# Patient Record
Sex: Male | Born: 1985 | Race: White | Hispanic: No | Marital: Married | Smoking: Never smoker
Health system: Southern US, Community
[De-identification: ages and names within clinical notes are randomized; demographics above are authoritative.]

## PROBLEM LIST (undated history)

## (undated) DIAGNOSIS — G4730 Sleep apnea, unspecified: Secondary | ICD-10-CM

## (undated) DIAGNOSIS — E669 Obesity, unspecified: Secondary | ICD-10-CM

## (undated) HISTORY — PX: WISDOM TOOTH EXTRACTION: SHX21

## (undated) HISTORY — DX: Sleep apnea, unspecified: G47.30

## (undated) HISTORY — DX: Obesity, unspecified: E66.9

---

## 2006-04-07 ENCOUNTER — Emergency Department (HOSPITAL_COMMUNITY): Admission: EM | Admit: 2006-04-07 | Discharge: 2006-04-07 | Payer: Self-pay | Admitting: Emergency Medicine

## 2006-04-08 ENCOUNTER — Emergency Department (HOSPITAL_COMMUNITY): Admission: EM | Admit: 2006-04-08 | Discharge: 2006-04-08 | Payer: Self-pay | Admitting: Emergency Medicine

## 2016-06-17 ENCOUNTER — Ambulatory Visit (INDEPENDENT_AMBULATORY_CARE_PROVIDER_SITE_OTHER): Payer: Managed Care, Other (non HMO) | Admitting: Primary Care

## 2016-06-17 ENCOUNTER — Encounter: Payer: Self-pay | Admitting: Primary Care

## 2016-06-17 VITALS — BP 128/82 | HR 91 | Temp 98.1°F | Ht 72.5 in | Wt 247.4 lb

## 2016-06-17 DIAGNOSIS — R200 Anesthesia of skin: Secondary | ICD-10-CM | POA: Insufficient documentation

## 2016-06-17 DIAGNOSIS — M62838 Other muscle spasm: Secondary | ICD-10-CM

## 2016-06-17 DIAGNOSIS — R0789 Other chest pain: Secondary | ICD-10-CM | POA: Insufficient documentation

## 2016-06-17 DIAGNOSIS — M6248 Contracture of muscle, other site: Secondary | ICD-10-CM | POA: Diagnosis not present

## 2016-06-17 DIAGNOSIS — R208 Other disturbances of skin sensation: Secondary | ICD-10-CM

## 2016-06-17 MED ORDER — CYCLOBENZAPRINE HCL 5 MG PO TABS: 5.0000 mg | ORAL_TABLET | Freq: Every day | ORAL | Status: DC

## 2016-06-17 NOTE — Assessment & Plan Note (Signed)
Located to left anterior chest wall for several months. Increased stress at work over past several months. No family history of heart disease. ECG today unremarkable (NSR rate of 79, no PVC/PAC, ST depression/elevation.  Negative asthma or GERD indications for cause of pain. Suspect stress related. Will continue to monitor.

## 2016-06-17 NOTE — Progress Notes (Signed)
Subjective:    Patient ID: Danny Spencer, male    DOB: 05/22/1986, 30 y.o.   MRN: [id number]  HPI  Mr. Danny Spencer is a 30 year old male who presents today to establish care and discuss the problems mentioned below. Will obtain old records.  1) Chest Tightness: Located to the left upper chest wall that has been present for the past several months. The pain is intermittent and he describes it as "squeezing". He endorses increased stress and has felt anxious over the past several months. 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 Denies cough, esophageal burning throat fullness, abdominal pain. Denies family history heart disease, wheezing, diaphoresis, weakness. GAD 7 score of 4.   2) Numbness: Located to bilateral upper extremities that he will notice when laying down at night in bed. He has noticed tightness to the left side of his neck since these symptoms began. This numbness has been present for the past 1 month. Denies neck injury, shoulder pain, back pain. No particular position will affect the numbness (can lay flat and experience symptoms). He's not taken anything OTC for his symptoms.   Review of Systems  Respiratory: Negative for shortness of breath.   Cardiovascular: Positive for palpitations.       Chest tightness  Gastrointestinal:       No esophageal burning  Neurological: Positive for numbness. Negative for dizziness and headaches.  Psychiatric/Behavioral: Negative for sleep disturbance. The patient is not nervous/anxious.        Increased stress at work       No past medical history on file.   Social History   Social History  . Marital Status: Single    Spouse Name: N/A  . Number of Children: N/A  . Years of Education: N/A   Occupational History  . Not on file.   Social History Main Topics  . Smoking status: Never Smoker   . Smokeless tobacco: Not on file  . Alcohol Use: No  . Drug Use: No  .  Sexual Activity: Not on file   Other Topics Concern  . Not on file   Social History Narrative   Married.   2 children.   Work for [employer] and also with a [employer] business.    Enjoys golfing, watching sports.     Past Surgical History  Procedure Laterality Date  . Wisdom tooth extraction      No family history on file.  No Known Allergies  No current outpatient prescriptions on file prior to visit.   No current facility-administered medications on file prior to visit.    BP 128/82 mmHg  Pulse 91  Temp(Src) 98.1 F (36.7 C) (Oral)  Ht 6' 0.5" (1.842 m)  Wt 247 lb 6.4 oz (112.22 kg)  BMI 33.07 kg/m2  SpO2 98%    Objective:   Physical Exam  Constitutional: He appears well-nourished.  Neck: Neck supple. Muscular tenderness present. No spinous process tenderness present.  Tightness to left side of neck with right rotation. Other wise exam unremarkable  Cardiovascular: Normal rate and regular rhythm.   Pulmonary/Chest: Effort normal and breath sounds normal.  Musculoskeletal:       Thoracic back: He exhibits normal range of motion, no tenderness and no pain.       Lumbar back: He exhibits normal range of motion, no tenderness and no pain.  Chest tightness not reproducible.   Neurological: No cranial nerve  deficit.  Skin: Skin is warm and dry.  Psychiatric: He has a normal mood and affect.          Assessment & Plan:

## 2016-06-17 NOTE — Assessment & Plan Note (Signed)
Located to bilateral upper extremities only when laying at night. Suspect muscle spasm that could likely be compressing nerve causing symptoms.  No injury/trauma, shoulder pain. Will try low dose Flexeril HS and have him follow up as needed.

## 2016-06-17 NOTE — Patient Instructions (Addendum)
You may take the Flexeril (cyclobenzaprine) tablets at bedtime as needed to help relax your neck muscles.   The ECG of your heart looks normal which is reassuring. I suspect your chest pressure is related to stress.  Please notify me if no improvement in your symptoms in the next 4 weeks.  Please schedule a physical with me within the next 3-6 months. You may also schedule a lab only appointment 3-4 days prior. We will discuss your lab results in detail during your physical.  It was a pleasure to meet you today! Please don't hesitate to call me with any questions. Welcome to [facility]!

## 2016-06-17 NOTE — Progress Notes (Signed)
Pre visit review using our clinic review tool, if applicable. No additional management support is needed unless otherwise documented below in the visit note. 

## 2018-06-20 ENCOUNTER — Emergency Department (HOSPITAL_COMMUNITY)
Admission: EM | Admit: 2018-06-20 | Discharge: 2018-06-20 | Disposition: A | Discharge status: Home / Self Care | Payer: Managed Care, Other (non HMO) | Attending: Emergency Medicine | Admitting: Emergency Medicine

## 2018-06-20 ENCOUNTER — Emergency Department (HOSPITAL_COMMUNITY): Payer: Managed Care, Other (non HMO)

## 2018-06-20 ENCOUNTER — Encounter (HOSPITAL_COMMUNITY): Payer: Self-pay | Admitting: Emergency Medicine

## 2018-06-20 DIAGNOSIS — N50811 Right testicular pain: Secondary | ICD-10-CM | POA: Insufficient documentation

## 2018-06-20 DIAGNOSIS — N50819 Testicular pain, unspecified: Secondary | ICD-10-CM

## 2018-06-20 LAB — URINALYSIS, ROUTINE W REFLEX MICROSCOPIC
Bilirubin Urine: NEGATIVE
GLUCOSE, UA: NEGATIVE mg/dL
Hgb urine dipstick: NEGATIVE
Ketones, ur: NEGATIVE mg/dL
LEUKOCYTES UA: NEGATIVE
NITRITE: NEGATIVE
PROTEIN: NEGATIVE mg/dL
Specific Gravity, Urine: 1.024 (ref 1.005–1.030)
pH: 6 (ref 5.0–8.0)

## 2018-06-20 NOTE — ED Provider Notes (Signed)
[facility]-EMERGENCY DEPT Provider Note   CSN: [id number] Arrival date & time: 06/20/18  1621     History   Chief Complaint Chief Complaint  Patient presents with  . Groin Pain    HPI Danny Spencer is a 32 y.o. male.  Danny Spencer is a 32 y.o. Male who is otherwise healthy, presents to the emergency department for evaluation of right testicular pain intermittently over the past 2 weeks.  Patient reports he first experienced the pain during intercourse when he felt like his right testicle was pulled up towards his body and stuck he felt like he needed to pull it down, since then he has not had that exact sensation anymore but has had some intermittent sharp pains and was current concerned he might of bruised or injured the testicle.  Today while at lunch she started to have a more persistent sharp pain so presented to the emergency department for evaluation.  He has not noted any swelling or redness, he has not felt any masses or bumps.  He has had no difficulty urinating, no dysuria or urinary frequency.  He denies any abdominal pain, nausea or vomiting.  No fevers or chills.  No history of injury or surgery to the testicle.  Is not tried anything prior to arrival to treat his symptoms, no other aggravating or alleviating factors.     History reviewed. No pertinent past medical history.  Patient Active Problem List   Diagnosis Date Noted  . Chest tightness 06/17/2016  . Numbness of upper extremity 06/17/2016    Past Surgical History:  Procedure Laterality Date  . WISDOM TOOTH EXTRACTION          Home Medications    Prior to Admission medications   Medication Sig Start Date End Date Taking? Authorizing Provider  ibuprofen (ADVIL,MOTRIN) 200 MG tablet Take 400 mg by mouth daily as needed for headache or moderate pain.   Yes [provider]    Family History No family history on file.  Social History Social History   Tobacco Use  .  Smoking status: Never Smoker  . Smokeless tobacco: Never Used  Substance Use Topics  . Alcohol use: No    Alcohol/week: 0.0 oz  . Drug use: No     Allergies   Patient has no known allergies.   Review of Systems Review of Systems  Constitutional: Negative for chills and fever.  Respiratory: Negative for shortness of breath.   Cardiovascular: Negative for chest pain.  Gastrointestinal: Negative for abdominal pain, nausea and vomiting.  Genitourinary: Positive for testicular pain. Negative for difficulty urinating, discharge, dysuria, flank pain, frequency, hematuria, penile pain, penile swelling and scrotal swelling.  Musculoskeletal: Negative for arthralgias and myalgias.  Skin: Negative for color change and rash.  All other systems reviewed and are negative.    Physical Exam Updated Vital Signs BP (!) 154/92 (BP Location: Right Arm)   Pulse 76   Temp 98.5 F (36.9 C) (Oral)   Resp 18   SpO2 99%   Physical Exam  Constitutional: He appears well-developed and well-nourished. No distress.  HENT:  Head: Normocephalic and atraumatic.  Eyes: Right eye exhibits no discharge. Left eye exhibits no discharge.  Neck: Neck supple.  Cardiovascular: Normal rate, regular rhythm, normal heart sounds and intact distal pulses.  Pulmonary/Chest: Effort normal and breath sounds normal. No respiratory distress.  Respirations equal and unlabored, patient able to speak in full sentences, lungs clear to auscultation bilaterally  Abdominal: Soft. Bowel sounds  are normal. He exhibits no distension and no mass. There is no tenderness. There is no guarding. Hernia confirmed negative in the right inguinal area and confirmed negative in the left inguinal area.  Genitourinary: Testes normal and penis normal. Cremasteric reflex is present. Right testis shows no mass, no swelling and no tenderness. Right testis is descended. Left testis shows no mass, no swelling and no tenderness. 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 Circumcised.  Musculoskeletal: He exhibits no edema or deformity.  Lymphadenopathy: No inguinal adenopathy noted on the right or left side.  Neurological: He is alert. Coordination normal.  Skin: Skin is warm and dry. Capillary refill takes less than 2 seconds. He is not diaphoretic.  Psychiatric: He has a normal mood and affect. His behavior is normal.  Nursing note and vitals reviewed.    ED Treatments / Results  Labs (all labs ordered are listed, but only abnormal results are displayed) Labs Reviewed  URINALYSIS, ROUTINE W REFLEX MICROSCOPIC    EKG None  Radiology [place] Scrotum W/doppler  Result Date: 06/20/2018 CLINICAL DATA:  Right testicular and groin pain for the past 2 weeks. EXAM: SCROTAL ULTRASOUND DOPPLER ULTRASOUND OF THE TESTICLES TECHNIQUE: Complete ultrasound examination of the testicles, epididymis, and other scrotal structures was performed. Color and spectral Doppler ultrasound were also utilized to evaluate blood flow to the testicles. COMPARISON:  None. FINDINGS: Right testicle Measurements: 4.4 x 3.3 x 2.5 cm. No mass or microlithiasis visualized. Left testicle Measurements: 4.3 x 2.9 x 2.6 cm. No mass or microlithiasis visualized. Right epididymis:  Normal in size and appearance. Left epididymis:  Normal in size and appearance. Hydrocele:  None visualized. Varicocele:  None visualized. Pulsed Doppler interrogation of both testes demonstrates normal low resistance arterial and venous waveforms bilaterally. Other: No abnormality demonstrated in the right angle canal region at the location of groin pain. IMPRESSION: Normal examination. Electronically Signed   By: Beckie SaltsSteven  Reid M.D.   On: 06/20/2018 18:38    Procedures Procedures (including critical care time)  Medications Ordered in ED Medications - No data to display   Initial Impression / Assessment and Plan / ED Course  I have reviewed the triage vital signs and the nursing notes.  Pertinent labs &  imaging results that were available during my care of the patient were reviewed by me and considered in my medical decision making (see chart for details).  Presents for evaluation of right testicular pain intermittently over the past 2 weeks after he felt like he injured it during intercourse.  No swelling, erythema, tenderness or palpable masses on exam.  Urinalysis is clear and scrotal ultrasound shows no acute abnormalities.  Encourage patient to wear supportive underwear, if symptoms persist he is to follow-up with urology.  Return precautions discussed.  Patient expresses understanding and is in agreement with plan.  Final Clinical Impressions(s) / ED Diagnoses   Final diagnoses:  Testicle pain    ED Discharge Orders    None       Legrand RamsFord, Pinkney Venard N, PA-C 06/20/18 2036    Rolland PorterJames, Mark, MD 06/25/18 [phone]

## 2018-06-20 NOTE — ED Notes (Signed)
U/s is here to begin their study. Pt. Is here with his significant other.

## 2018-06-20 NOTE — Discharge Instructions (Signed)
Your ultrasound and urinalysis look good today.

## 2018-06-20 NOTE — ED Triage Notes (Signed)
Patient here from home with complaints of right testicle pain x2 weeks. States that it feels like it is "drawing up into belly". Denis trouble urinating.

## 2018-10-20 ENCOUNTER — Ambulatory Visit: Payer: Managed Care, Other (non HMO) | Admitting: Primary Care

## 2018-10-20 ENCOUNTER — Encounter: Payer: Self-pay | Admitting: Primary Care

## 2018-10-20 VITALS — BP 146/92 | HR 79 | Temp 97.7°F | Ht 72.5 in | Wt 246.5 lb

## 2018-10-20 DIAGNOSIS — R5383 Other fatigue: Secondary | ICD-10-CM

## 2018-10-20 DIAGNOSIS — R0683 Snoring: Secondary | ICD-10-CM

## 2018-10-20 NOTE — Assessment & Plan Note (Signed)
Chronic that has progressed over the last 1 year. Also with daytime drowsiness. Epworth Sleepiness Scale score of 13 today. Referral placed to pulmonology for sleep study.   Also check labs consisting of CMP, TSH.

## 2018-10-20 NOTE — Patient Instructions (Signed)
Stop by the lab prior to leaving today. I will notify you of your results once received.   You will be contacted regarding your referral to pulmonology for the sleep study.  Please let us know if you have not been contacted within one week.   It was a pleasure to see you today!  

## 2018-10-20 NOTE — Progress Notes (Signed)
Subjective:    Patient ID: Danny Spencer, male    DOB: October 15, 1986, 32 y.o.   MRN: [id number]  HPI  Danny Spencer is a 32 year old male who presents today with a chief complaint of sleep disturbance.  He endorses snoring that has progressed over the last one year. E once only snored when laying on his back, now he snores every night regardless of his position. He does wake up during the night 2-4 times, most every night. He will fall back asleep within 5-7 minutes. He endorses daytime tiredness and feels as though he could nap daily. He naps on occasion. He denies a family history of sleep apnea.   He does have some difficulty falling asleep, will have thoughts about the day and for the next day. He denies increased stress/anxiety.    Review of Systems  Respiratory: Negative for shortness of breath.        Snoring   Cardiovascular: Negative for chest pain.  Psychiatric/Behavioral: The patient is not nervous/anxious.        No past medical history on file.   Social History   Socioeconomic History  . Marital status: Single    Spouse name: Not on file  . Number of children: Not on file  . Years of education: Not on file  . Highest education level: Not on file  Occupational History  . Not on file  Social Needs  . Financial resource strain: Not on file  . Food insecurity:    Worry: Not on file    Inability: Not on file  . Transportation needs:    Medical: Not on file    Non-medical: Not on file  Tobacco Use  . Smoking status: Never Smoker  . Smokeless tobacco: Never Used  Substance and Sexual Activity  . Alcohol use: No    Alcohol/week: 0.0 standard drinks  . Drug use: No  . Sexual activity: Not on file  Lifestyle  . Physical activity:    Days per week: Not on file    Minutes per session: Not on file  . Stress: Not on file  Relationships  . Social connections:    Talks on phone: Not on file    Gets together: Not on file    Attends religious service: Not on file   Active member of club or organization: Not on file    Attends meetings of clubs or organizations: Not on file    Relationship status: Not on file  . Intimate partner violence:    Fear of current or ex partner: Not on file    Emotionally abused: Not on file    Physically abused: Not on file    Forced sexual activity: Not on file  Other Topics Concern  . Not on file  Social History Narrative   Married.   2 children.   Work for [employer] and also with a [employer].    Enjoys golfing, watching sports.     Past Surgical History:  Procedure Laterality Date  . WISDOM TOOTH EXTRACTION      No family history on file.  No Known Allergies  Current Outpatient Medications on File Prior to Visit  Medication Sig Dispense Refill  . ibuprofen (ADVIL,MOTRIN) 200 MG tablet Take 400 mg by mouth daily as needed for headache or moderate pain.     No current facility-administered medications on file prior to visit.     BP (!) 146/92   Pulse 79   Temp 97.7 F (  36.5 C) (Oral)   Ht 6' 0.5" (1.842 m)   Wt 246 lb 8 oz (111.8 kg)   SpO2 98%   BMI 32.97 kg/m    Objective:   Physical Exam  Constitutional: He appears well-nourished.  Neck: Neck supple.  Cardiovascular: Normal rate and regular rhythm.  Respiratory: Effort normal and breath sounds normal.  Skin: Skin is warm and dry.  Psychiatric: He has a normal mood and affect.           Assessment & Plan:

## 2018-10-21 LAB — CBC
HCT: 44 % (ref 39.0–52.0)
HEMOGLOBIN: 15.5 g/dL (ref 13.0–17.0)
MCHC: 35.4 g/dL (ref 30.0–36.0)
MCV: 89.9 fl (ref 78.0–100.0)
PLATELETS: 266 10*3/uL (ref 150.0–400.0)
RBC: 4.89 Mil/uL (ref 4.22–5.81)
RDW: 12.6 % (ref 11.5–15.5)
WBC: 7.1 10*3/uL (ref 4.0–10.5)

## 2018-10-21 LAB — COMPREHENSIVE METABOLIC PANEL
ALBUMIN: 4.6 g/dL (ref 3.5–5.2)
ALT: 33 U/L (ref 0–53)
AST: 21 U/L (ref 0–37)
Alkaline Phosphatase: 56 U/L (ref 39–117)
BUN: 16 mg/dL (ref 6–23)
CHLORIDE: 102 meq/L (ref 96–112)
CO2: 27 mEq/L (ref 19–32)
Calcium: 9.4 mg/dL (ref 8.4–10.5)
Creatinine, Ser: 1.05 mg/dL (ref 0.40–1.50)
GFR: 86.08 mL/min (ref >60.00)
Glucose, Bld: 86 mg/dL (ref 70–99)
POTASSIUM: 3.8 meq/L (ref 3.5–5.1)
Sodium: 139 mEq/L (ref 135–145)
Total Bilirubin: 0.7 mg/dL (ref 0.2–1.2)
Total Protein: 7.3 g/dL (ref 6.0–8.3)

## 2018-10-21 LAB — TSH: TSH: 1.72 u[IU]/mL (ref 0.35–4.50)

## 2018-10-22 ENCOUNTER — Encounter: Payer: Self-pay | Admitting: *Deleted

## 2018-10-22 ENCOUNTER — Encounter (INDEPENDENT_AMBULATORY_CARE_PROVIDER_SITE_OTHER): Payer: Self-pay

## 2018-10-26 ENCOUNTER — Ambulatory Visit (INDEPENDENT_AMBULATORY_CARE_PROVIDER_SITE_OTHER): Payer: Managed Care, Other (non HMO) | Admitting: Internal Medicine

## 2018-10-26 ENCOUNTER — Encounter: Payer: Self-pay | Admitting: Internal Medicine

## 2018-10-26 VITALS — BP 140/84 | HR 86 | Ht 73.0 in | Wt 247.2 lb

## 2018-10-26 DIAGNOSIS — G4719 Other hypersomnia: Secondary | ICD-10-CM

## 2018-10-26 NOTE — Patient Instructions (Signed)
Plan for SLeep study to assess for sleep apnea

## 2018-10-26 NOTE — Progress Notes (Signed)
Name: Danny Spencer MRN: [id number] DOB: 1986-10-27     CONSULTATION DATE: 10.29.19 REFERRING MD : Chestine Spore  CHIEF COMPLAINT: excessive daytime sleepiness  STUDIES:  03/2006    CXR independently reviewed by Me today No effusions No pneumonia   HPI 32 yo Pleasant white male seen today for excessive daytime sleepiness  seen today for problems with sleep Patient  has been having sleep problems for many years Patient has been having excessive daytime sleepiness Patient has been having extreme fatigue and tiredness, lack of energy +  very Loud snoring every night    Discussed sleep data and reviewed with patient.  Encouraged proper weight management.  Discussed driving precautions and its relationship with hypersomnolence.  Discussed operating dangerous equipment and its relationship with hypersomnolence.  Discussed sleep hygiene, and benefits of a fixed sleep waked time.  The importance of getting eight or more hours of sleep discussed with patient.  Discussed limiting the use of the computer and television before bedtime.  Decrease naps during the day, so night time sleep will become enhanced.  Limit caffeine, and sleep deprivation.  HTN, stroke, and heart failure are potential risk factors.   No signs of infection  No signs of CHF   EPWORTH SLEEP SCORE 10  PAST MEDICAL HISTORY :   has no past medical history on file.  has a past surgical history that includes Wisdom tooth extraction. Prior to Admission medications   Medication Sig Start Date End Date Taking? Authorizing Provider  ibuprofen (ADVIL,MOTRIN) 200 MG tablet Take 400 mg by mouth daily as needed for headache or moderate pain.   Yes [provider]   No Known Allergies  FAMILY HISTORY:  +HTN  SOCIAL HISTORY:  reports that he has never smoked. He has never used smokeless tobacco. He reports that he does not drink alcohol or use drugs.  REVIEW OF SYSTEMS:   Constitutional: Negative for fever,  chills, weight loss, +malaise/fatigue  HENT: Negative for hearing loss, ear pain, nosebleeds, congestion, sore throat, neck pain, tinnitus and ear discharge.   Eyes: Negative for blurred vision, double vision, photophobia, pain, discharge and redness.  Respiratory: Negative for cough, hemoptysis, sputum production, shortness of breath, wheezing and stridor.   Cardiovascular: Negative for chest pain, palpitations, orthopnea, claudication, leg swelling and PND.  Gastrointestinal: Negative for heartburn, nausea, vomiting, abdominal pain, diarrhea, constipation, blood in stool and melena.  Genitourinary: Negative for dysuria, urgency, frequency, hematuria and flank pain.  Musculoskeletal: Negative for myalgias, back pain, joint pain and falls.  Skin: Negative for itching and rash.  Neurological: Negative for dizziness, tingling, tremors, sensory change, speech change, focal weakness, seizures, loss of consciousness, weakness and headaches.  Endo/Heme/Allergies: Negative for environmental allergies and polydipsia. Does not bruise/bleed easily.  ALL OTHER ROS ARE NEGATIVE    BP 140/84 (BP Location: Left Arm, Cuff Size: Normal)   Pulse 86   Ht 6\' 1"  (1.854 m)   Wt 247 lb 3.2 oz (112.1 kg)   SpO2 97%   BMI 32.61 kg/m    Physical Examination:   GENERAL:NAD, no fevers, chills, no weakness + fatigue HEAD: Normocephalic, atraumatic.  EYES: Pupils equal, round, reactive to light. Extraocular muscles intact. No scleral icterus.  MOUTH: Moist mucosal membrane.   EAR, NOSE, THROAT: Clear without exudates. No external lesions.  NECK: Supple. No thyromegaly. No nodules. No JVD.  PULMONARY:CTA B/L no wheezes, no crackles, no rhonchi CARDIOVASCULAR: S1 and S2. Regular rate and rhythm. No murmurs, rubs, or gallops. No edema.  GASTROINTESTINAL:  Soft, nontender, nondistended. No masses. Positive bowel sounds.  MUSCULOSKELETAL: No swelling, clubbing, or edema. Range of motion full in all extremities.    NEUROLOGIC: Cranial nerves II through XII are intact. No gross focal neurological deficits.  SKIN: No ulceration, lesions, rashes, or cyanosis. Skin warm and dry. Turgor intact.  PSYCHIATRIC: Mood, affect within normal limits. The patient is awake, alert and oriented x 3. Insight, judgment intact.      ASSESSMENT / PLAN: 32 yo overweight pleasant white male with signs and symptoms of sleep apnea  Plan for sleep study  Obesity -recommend significant weight loss -recommend changing diet  Deconditioned state -Recommend increased daily activity and exercise     Patient  satisfied with Plan of action and management. All questions answered Follow up after test completed  Lucie Leather, M.D.  [facility]  Medical Director [facility] Medical Director [facility]

## 2018-11-12 ENCOUNTER — Telehealth: Payer: Self-pay | Admitting: Internal Medicine

## 2018-11-12 NOTE — Telephone Encounter (Signed)
Please call to discuss sleep study.

## 2018-11-12 NOTE — Telephone Encounter (Signed)
Pt aware sleep study must be approved by insurance 1st. Nothing further needed.

## 2018-12-09 DIAGNOSIS — G4733 Obstructive sleep apnea (adult) (pediatric): Secondary | ICD-10-CM | POA: Diagnosis not present

## 2018-12-14 DIAGNOSIS — G4733 Obstructive sleep apnea (adult) (pediatric): Secondary | ICD-10-CM | POA: Diagnosis not present

## 2018-12-15 ENCOUNTER — Telehealth: Payer: Self-pay | Admitting: *Deleted

## 2018-12-15 DIAGNOSIS — G4733 Obstructive sleep apnea (adult) (pediatric): Secondary | ICD-10-CM

## 2018-12-15 DIAGNOSIS — G4719 Other hypersomnia: Secondary | ICD-10-CM

## 2018-12-15 NOTE — Telephone Encounter (Signed)
LMTCB  Results of HST: 17 total apneas Mild OSA Recommend auto-cpap with pressure range of 5-15 cmH20.

## 2018-12-15 NOTE — Telephone Encounter (Signed)
Pt aware of results. Orders placed  Nothing further needed. 

## 2019-01-11 NOTE — Addendum Note (Signed)
Addended by: Maxwell Marion A on: 01/11/2019 02:12 PM   Modules accepted: Orders

## 2019-08-05 ENCOUNTER — Encounter: Payer: Self-pay | Admitting: Family Medicine

## 2019-08-05 ENCOUNTER — Other Ambulatory Visit: Payer: Self-pay

## 2019-08-05 ENCOUNTER — Ambulatory Visit: Payer: Managed Care, Other (non HMO) | Admitting: Family Medicine

## 2019-08-05 VITALS — BP 140/76 | HR 90 | Temp 98.1°F | Ht 72.75 in | Wt 249.5 lb

## 2019-08-05 DIAGNOSIS — R0789 Other chest pain: Secondary | ICD-10-CM

## 2019-08-05 DIAGNOSIS — G4733 Obstructive sleep apnea (adult) (pediatric): Secondary | ICD-10-CM

## 2019-08-05 DIAGNOSIS — F419 Anxiety disorder, unspecified: Secondary | ICD-10-CM

## 2019-08-05 DIAGNOSIS — E786 Lipoprotein deficiency: Secondary | ICD-10-CM

## 2019-08-05 DIAGNOSIS — E669 Obesity, unspecified: Secondary | ICD-10-CM

## 2019-08-05 NOTE — Patient Instructions (Addendum)
Weight loss would help your sleep apnea  Think about working on healthy diet and exercise   I placed an order for a cardiology consultation regarding your chest symptoms  The office will call you to schedule that   Exercise and eating fish/ taking fish oil may help HDL cholesterol   Meditation is good for stress  The phone apps Calm and Headspace are very helpful

## 2019-08-05 NOTE — Progress Notes (Signed)
Subjective:    Patient ID: Danny Spencer, male    DOB: 01/21/1986, 33 y.o.   MRN: [id number]  HPI Here to est care   Formerly saw NP Chestine Sporelark  Wants to discuss chest pains  In the past nl EKG  Has sob/tightness over L sided  Worse with exertion occ at rest  Perhaps worse in the heat  Stress/tension add to it possibly as well (occ happens at rest if he is feeling very stressed)    EKG today is stable  NSR with rate of 80 and no acute changes  Reassuring    Wt Readings from Last 3 Encounters:  08/05/19 249 lb 8 oz (113.2 kg)  10/26/18 247 lb 3.2 oz (112.1 kg)  10/20/18 246 lb 8 oz (111.8 kg)  needs to loose weight  No time to work on diet and exercise  Family does not eat the best  Does not exercise  33.14 kg/m   BP Readings from Last 3 Encounters:  08/05/19 140/76  10/26/18 140/84  10/20/18 (!) 146/92   Pulse Readings from Last 3 Encounters:  08/05/19 90  10/26/18 86  10/20/18 79   Tetanus shot - unsure when  Declines -very afraid of shots  Flu shot   fam hx No cancer   Married  Has 2 kids    Age 904 and 533 Son has tetrology of fallot   Non smoker  Alcohol- very rarely   Diagnosed with OSA in the past year  Ins would not cover his cpap   He works at [employer] - and does landscaping on the side  Some exercise from work  Does not eat donuts   Does not go for yearly physicals  Has to do a wellness screening at job-since he moved to a new location so he had to [facility]   There is a lipid profile from 2014 in the chart from this Tot chol 199 Trig 356 HDL 29 LDL99  He thinks last time total 219   He saw a counselor in the past for stress reaction  Danny Neerebecca Spencer at [facility]  He did some float therapy in the past -this was helpful just one time  Patient Active Problem List   Diagnosis Date Noted  . Obesity (BMI 30-39.9) 08/05/2019  . OSA (obstructive sleep apnea) 08/05/2019  . Low HDL (under 40) 08/05/2019  .  Anxiety 08/05/2019  . Snoring 10/20/2018  . Chest tightness 06/17/2016  . Numbness of upper extremity 06/17/2016   History reviewed. No pertinent past medical history. Past Surgical History:  Procedure Laterality Date  . WISDOM TOOTH EXTRACTION     Social History   Tobacco Use  . Smoking status: Never Smoker  . Smokeless tobacco: Never Used  Substance Use Topics  . Alcohol use: No    Alcohol/week: 0.0 standard drinks  . Drug use: No   History reviewed. No pertinent family history. No Known Allergies Current Outpatient Medications on File Prior to Visit  Medication Sig Dispense Refill  . ibuprofen (ADVIL,MOTRIN) 200 MG tablet Take 400 mg by mouth daily as needed for headache or moderate pain.     No current facility-administered medications on file prior to visit.     Review of Systems  Constitutional: Positive for fatigue. Negative for activity change, appetite change, fever and unexpected weight change.  HENT: Negative for congestion, rhinorrhea, sore throat and trouble swallowing.   Eyes: Negative for pain, redness, itching and visual disturbance.  Respiratory: Negative for cough, chest tightness, shortness of breath and wheezing.   Cardiovascular: Positive for chest pain. Negative for palpitations and leg swelling.  Gastrointestinal: Negative for abdominal pain, blood in stool, constipation, diarrhea and nausea.  Endocrine: Negative for cold intolerance, heat intolerance, polydipsia and polyuria.  Genitourinary: Negative for difficulty urinating, dysuria, frequency and urgency.  Musculoskeletal: Negative for arthralgias, joint swelling and myalgias.  Skin: Negative for pallor and rash.  Neurological: Negative for dizziness, tremors, weakness, numbness and headaches.  Hematological: Negative for adenopathy. Does not bruise/bleed easily.  Psychiatric/Behavioral: Negative for decreased concentration and dysphoric mood. The patient is nervous/anxious.        Stressed        Objective:   Physical Exam Constitutional:      General: He is not in acute distress.    Appearance: Normal appearance. He is well-developed. He is obese. He is not ill-appearing.  HENT:     Head: Normocephalic and atraumatic.     Right Ear: Tympanic membrane, ear canal and external ear normal.     Left Ear: Tympanic membrane, ear canal and external ear normal.     Nose: Nose normal.     Mouth/Throat:     Mouth: Mucous membranes are moist.  Eyes:     General: No scleral icterus.       Right eye: No discharge.        Left eye: No discharge.     Conjunctiva/sclera: Conjunctivae normal.     Pupils: Pupils are equal, round, and reactive to light.  Neck:     Musculoskeletal: Normal range of motion and neck supple.     Thyroid: No thyromegaly.     Vascular: No carotid bruit or JVD.  Cardiovascular:     Rate and Rhythm: Normal rate and regular rhythm.     Pulses: Normal pulses.     Heart sounds: Normal heart sounds. No gallop.   Pulmonary:     Effort: Pulmonary effort is normal. No respiratory distress.     Breath sounds: Normal breath sounds. No wheezing.  Chest:     Chest wall: No tenderness.  Abdominal:     General: Bowel sounds are normal. There is no distension or abdominal bruit.     Palpations: Abdomen is soft. There is no mass.     Tenderness: There is no abdominal tenderness.  Musculoskeletal:        General: No tenderness.     Right lower leg: No edema.     Left lower leg: No edema.  Lymphadenopathy:     Cervical: No cervical adenopathy.  Skin:    General: Skin is warm and dry.     Coloration: Skin is not pale.     Findings: No erythema or rash.     Comments: Solar lentigines diffusely   Neurological:     Mental Status: He is alert.     Cranial Nerves: No cranial nerve deficit.     Motor: No abnormal muscle tone.     Coordination: Coordination normal.     Deep Tendon Reflexes: Reflexes are normal and symmetric. Reflexes normal.  Psychiatric:        Attention  and Perception: He is attentive.        Mood and Affect: Mood is anxious.        Behavior: Behavior normal.        Thought Content: Thought content is not paranoid.        Cognition and Memory: Cognition and memory normal.  Comments: Anxious mood Timid and shy affect -at times difficult to get a history  He spoke candidly about his anxiety symptoms and stressors  Pleasant            Assessment & Plan:   Problem List Items Addressed This Visit      Respiratory   OSA (obstructive sleep apnea)    Unfortunately pt cannot afford cpap machine  He will continue to follow with pulmonary ? If aid is available Discussed risks of untreated OSA Strongly enc wt loss         Other   Chest tightness - Primary    L sided Both exertional and stress related (anxious)  High triglycerides in the past  Unhealthy lifestyle (unmotivated or willing to change) Reassuring EKG [facility]  Ref to cardiology for further eval  inst to seek care in ED if symptoms become severe      Relevant Orders   EKG 12-Lead (Completed)   Ambulatory referral to Cardiology   Obesity (BMI 30-39.9)    Discussed how this problem influences overall health and the risks it imposes  Reviewed plan for weight loss with lower calorie diet (via better food choices and also portion control or program like weight watchers) and exercise building up to or more than 30 minutes 5 days per week including some aerobic activity   Wt loss may help sleep apnea  Unsure if pt is motivated for lifestyle change      Low HDL (under 40)    [facility]  Disc exercise/fish oil to help raise HDL      Relevant Orders   Ambulatory referral to Cardiology   Anxiety    Stress related  Has had counseling in the past and did not find it helpful  Reviewed stressors/ coping techniques/symptoms/ support sources/ tx options and side effects in detail today  He may be open to  medication in the future  No doubt this adds to his chest discomfort  Of note: also needle phobic and refuses imms

## 2019-08-07 NOTE — Assessment & Plan Note (Signed)
Discussed how this problem influences overall health and the risks it imposes  Reviewed plan for weight loss with lower calorie diet (via better food choices and also portion control or program like weight watchers) and exercise building up to or more than 30 minutes 5 days per week including some aerobic activity   Wt loss may help sleep apnea  Unsure if pt is motivated for lifestyle change

## 2019-08-07 NOTE — Assessment & Plan Note (Signed)
Sent for more recent labs from [facility] urgent care  Disc exercise/fish oil to help raise HDL

## 2019-08-07 NOTE — Assessment & Plan Note (Signed)
L sided Both exertional and stress related (anxious)  High triglycerides in the past  Unhealthy lifestyle (unmotivated or willing to change) Reassuring EKG Sent for last labs from [facility] urgent care  Ref to cardiology for further eval  inst to seek care in ED if symptoms become severe

## 2019-08-07 NOTE — Assessment & Plan Note (Signed)
Unfortunately pt cannot afford cpap machine  He will continue to follow with pulmonary ? If aid is available Discussed risks of untreated OSA Strongly enc wt loss

## 2019-08-07 NOTE — Assessment & Plan Note (Addendum)
Stress related  Has had counseling in the past and did not find it helpful  Reviewed stressors/ coping techniques/symptoms/ support sources/ tx options and side effects in detail today  He may be open to medication in the future  No doubt this adds to his chest discomfort  Of note: also needle phobic and refuses imms

## 2019-08-25 ENCOUNTER — Encounter: Payer: Self-pay | Admitting: Cardiovascular Disease

## 2019-08-25 ENCOUNTER — Ambulatory Visit: Payer: Managed Care, Other (non HMO) | Admitting: Cardiovascular Disease

## 2019-08-25 ENCOUNTER — Other Ambulatory Visit: Payer: Self-pay

## 2019-08-25 VITALS — BP 124/85 | HR 58 | Ht 73.0 in | Wt 274.6 lb

## 2019-08-25 DIAGNOSIS — E782 Mixed hyperlipidemia: Secondary | ICD-10-CM | POA: Diagnosis not present

## 2019-08-25 DIAGNOSIS — R072 Precordial pain: Secondary | ICD-10-CM | POA: Diagnosis not present

## 2019-08-25 DIAGNOSIS — E669 Obesity, unspecified: Secondary | ICD-10-CM

## 2019-08-25 NOTE — Patient Instructions (Signed)
Medication Instructions:  Your Physician recommend you continue on your current medication as directed.    If you need a refill on your cardiac medications before your next appointment, please call your pharmacy.   Lab work: None  Testing/Procedures: Your physician has requested that you have an exercise tolerance test. For further information please visit [email or web address]. Please also follow instruction sheet, as given. [place]. [address]   Follow-Up: Your physician recommends that you schedule a follow-up appointment in 3 months with Dr. [facility] at [facility] [address], [place], [place] [address] Phone:  [phone]        You are scheduled for an Exercise Stress Test   Please arrive 15 minutes prior to your appointment time for registration and insurance purposes.  The test will take approximately 45 minutes to complete.  How to prepare for your Exercise Stress Test: . Do bring a list of your current medications with you.  If not listed below, you may take your medications as normal. . Do wear comfortable clothes (no dresses or overalls) and walking shoes, tennis shoes preferred (no heels or open toed shoes are allowed) . Do Not wear cologne, perfume, aftershave or lotions (deodorant is allowed). . Please report to [place], [address] for your test.  If these instructions are not followed, your test will have to be rescheduled.  If you have questions or concerns about your appointment, you can call the Stress Lab at [phone].  If you cannot keep your appointment, please provide 24 hours notification to the Stress Lab, to avoid a possible $50 charge to your account

## 2019-08-25 NOTE — Progress Notes (Signed)
Cardiology Office Note:   Date:  08/25/2019  NAME:  Danny Spencer    MRN: [id number] DOB:  [birth year]   PCP:  Judy Pimple, MD  Cardiologist:  Reatha Harps, MD  Electrophysiologist:  None   Referring MD: Judy Pimple, MD   Chief Complaint  Patient presents with  . Chest Pain   History of Present Illness:   Danny Spencer is a 33 y.o. male with a hx of obesity who is being seen today for the evaluation of chest pain at the request of [name], MD. he reports nearly daily chest tightness for over 1 year.  He reports he gets it most days, and it occurs shortly after waking or during the morning, and can stay with him most of the day and then resolve on its own in the afternoon.  He reports no triggers, or alleviating factors.  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  He reports no worsening with exertion, or alleviation with rest to suspect underlying obstructive CAD.  He denies any other associated symptoms.  He is a never smoker, and rarely consumes alcohol.  He does not have a formal exercise program, but has no limitations with work.  He works at [employer] and reports frequent variation in shifts, reporting to first shift and third shift at times.  He also has a long care business on the side.  He appears to work quite a bit.  CVD risk factors include hyperlipidemia and low HDL.  He is not diabetic, and has no strong family history of coronary heart disease.  He does have a son who was born with tetralogy of fallot, who is undergone complete repair.  He does report he has a diagnosis of sleep apnea, but has not been unable to afford his sleep machine.  He does report some tiredness during the day, due to his untreated sleep apnea.  Past Medical History: History reviewed. No pertinent past medical history.  Past Surgical History: Past Surgical History:  Procedure Laterality Date  . WISDOM TOOTH EXTRACTION       Current Medications: Current Meds  Medication Sig  . ibuprofen (ADVIL,MOTRIN) 200 MG tablet Take 400 mg by mouth daily as needed for headache or moderate pain.     Allergies:    Patient has no known allergies.   Social History: Social History   Socioeconomic History  . Marital status: Single    Spouse name: Not on file  . Number of children: Not on file  . Years of education: Not on file  . Highest education level: Not on file  Occupational History  . Not on file  Social Needs  . Financial resource strain: Not on file  . Food insecurity    Worry: Not on file    Inability: Not on file  . Transportation needs    Medical: Not on file    Non-medical: Not on file  Tobacco Use  . Smoking status: Never Smoker  . Smokeless tobacco: Never Used  Substance and Sexual Activity  . Alcohol use: No    Alcohol/week: 0.0 standard drinks  . Drug use: No  . Sexual activity: Not on file  Lifestyle  . Physical activity    Days per week: Not on file    Minutes per session: Not on file  . Stress: Not on file  Relationships  . Social Musician on phone:  Not on file    Gets together: Not on file    Attends religious service: Not on file    Active member of club or organization: Not on file    Attends meetings of clubs or organizations: Not on file    Relationship status: Not on file  Other Topics Concern  . Not on file  Social History Narrative   Married.   2 children.   Work for [employer] and also with a [employer].    Enjoys golfing, watching sports.      Family History: The patient's family history includes Congenital heart disease in his son.  ROS:   All other ROS reviewed and negative. Pertinent positives noted in the HPI.     EKGs/Labs/Other Studies Reviewed:   The following studies were personally reviewed by me today:  EKG:  EKG is ordered today. The ekg ordered today demonstrates sinus bradycardia, heart rate 53, normal intervals, no  ST-T changes to suggest acute ischemia or prior infarction, and was personally reviewed by me.   Recent Labs: 10/20/2018: ALT 33; BUN 16; Creatinine, Ser 1.05; Hemoglobin 15.5; Platelets 266.0; Potassium 3.8; Sodium 139; TSH 1.72   Recent Lipid Panel No results found for: CHOL, TRIG, HDL, CHOLHDL, VLDL, LDLCALC, LDLDIRECT  Physical Exam:   VS:  BP 124/85   Pulse (!) 58   Ht 6\' 1"  (1.854 m)   Wt 274 lb 9.6 oz (124.6 kg)   BMI 36.23 kg/m    Wt Readings from Last 3 Encounters:  08/25/19 274 lb 9.6 oz (124.6 kg)  08/05/19 249 lb 8 oz (113.2 kg)  10/26/18 247 lb 3.2 oz (112.1 kg)    General: Well nourished, well developed, in no acute distress Heart: Atraumatic, normal size  Eyes: PEERLA, EOMI  Neck: Supple, no JVD Endocrine: No thryomegaly Cardiac: Normal S1, S2; RRR; no murmurs, rubs, or gallops Lungs: Clear to auscultation bilaterally, no wheezing, rhonchi or rales  Abd: Soft, nontender, no hepatomegaly  Ext: No edema, pulses 2+ Musculoskeletal: No deformities, BUE and BLE strength normal and equal Skin: Warm and dry, no rashes   Neuro: Alert and oriented to person, place, time, and situation, CNII-XII grossly intact, no focal deficits  Psych: Normal mood and affect   ASSESSMENT:   NAME@ is a 33 y.o. male who presents for the following: 1. Precordial pain   2. Obesity (BMI 30-39.9)   3. Mixed hyperlipidemia    PLAN:   1. Precordial pain -Atypical chest pain as described above.  Overall, I have a low suspicion for underlying obstructive coronary disease.  His ECG is very normal.  He has only a risk factor of obesity for coronary heart disease, and untreated sleep apnea.  I do wonder if his untreated sleep apnea is creating a constant stress state for his body to possibly explain his symptoms. -The easiest thing for him to do is to do a plain exercise treadmill test, to provide further reassurance that this is not a cardiac problem.  We will schedule this at his earliest  convenience and I will see him back in 3 months after this.  2. Obesity, morbid, BMI 50 or higher ([place]) -Counseled on the importance of diet and exercise  3. Mixed hyperlipidemia -Low HDL noted -We will pursue diet and exercise for now   Disposition: Return in about 3 months (around 11/25/2019).  Medication Adjustments/Labs and Tests Ordered: Current medicines are reviewed at length with the patient today.  Concerns regarding medicines are outlined  above.  No orders of the defined types were placed in this encounter.  No orders of the defined types were placed in this encounter.   There are no Patient Instructions on file for this visit.   Signed, Lenna GilfordWesley T. Flora Lipps'Neal, MD [facility]  [facility]  [address], [address] [place], [place] [address] [phone]  08/25/2019 8:28 AM

## 2019-08-26 ENCOUNTER — Telehealth (HOSPITAL_COMMUNITY): Payer: Self-pay

## 2019-08-26 NOTE — Telephone Encounter (Signed)
Encounter complete. 

## 2019-08-29 ENCOUNTER — Telehealth: Payer: Self-pay | Admitting: *Deleted

## 2019-08-29 NOTE — Telephone Encounter (Signed)
Called  Patient  - no answer - left message to call back - to schedule covid test for  gxt - patient will able to have test  08/30/19    If  gxt can be switch to  09/02/19  awaiting to discuss with patient and  Nuclear dept.

## 2019-08-29 NOTE — Telephone Encounter (Signed)
-----   Message from Georga Kaufmann, [place] sent at 08/26/2019 10:09 AM EDT ----- Regarding: Covid test Good Day,   There is a GXT scheduled on August 31, 2019, @ at 3:30 pm patient of Dr.O'Neal name Danny Spencer.  Please call the patient and schedule/order COVID testing at [facility] the patient is aware the nurse will be calling to set up COVID testing and self-isolate after the COVID testing is performed until her appointment.    If the patient does not have the COVID testing performed and if we don't have the testing results back the stress test appointment, we will have to cancel the test.  Katharine Look

## 2019-08-30 NOTE — Telephone Encounter (Signed)
Called and spoke to patient- informed him  Of situation - he would need a covid test done prior to gxt - RN  Offered patient to have covid test today and reschedule  GXT for Friday 09/02/19 . Patient states he tried to call yesterday to inform office he will not be able to do test 08/31/19, but could not get through to talk to someone. RN offered  Friday's appointment. Patient decline and stated he could not do the test this soon .  Patient would like the office to call next week to reschedule the  GXT. RN informed patient will send message to scheduler to do so.   RN spoke to scheduler- appointment cancelled and will be reschedule. Aware patient would like for call back next week to reschedule.

## 2019-08-31 ENCOUNTER — Inpatient Hospital Stay (HOSPITAL_COMMUNITY): Admission: RE | Admit: 2019-08-31 | Payer: Managed Care, Other (non HMO) | Source: Ambulatory Visit

## 2019-09-09 ENCOUNTER — Encounter (HOSPITAL_COMMUNITY): Payer: Self-pay | Admitting: Cardiovascular Disease

## 2019-09-22 ENCOUNTER — Telehealth (HOSPITAL_COMMUNITY): Payer: Self-pay

## 2019-09-22 NOTE — Telephone Encounter (Signed)
New message   Just an FYI. We have made several attempts to contact this patient including sending a letter to schedule or reschedule their EXERCISE TOLERANCE TEST. We will be removing the patient from the WQ.   9.24.20 @ 9:34am both # are the same  - lm on home vm Danny Spencer  9.11.20 mail reminder letter Danny Spencer  9.9.20 @ 2:56pm lm on home vm - Danny Spencer  9.1.20 Cancel Rsn: Patient (patient wants a call next week to R/s appt )

## 2019-10-20 ENCOUNTER — Encounter (HOSPITAL_COMMUNITY): Payer: Self-pay | Admitting: Cardiovascular Disease

## 2019-11-17 ENCOUNTER — Ambulatory Visit: Payer: Managed Care, Other (non HMO) | Admitting: Cardiovascular Disease

## 2020-01-17 ENCOUNTER — Encounter: Payer: Self-pay | Admitting: Family Medicine

## 2020-01-17 ENCOUNTER — Other Ambulatory Visit: Payer: Self-pay

## 2020-01-17 ENCOUNTER — Ambulatory Visit (INDEPENDENT_AMBULATORY_CARE_PROVIDER_SITE_OTHER)
Admission: RE | Admit: 2020-01-17 | Discharge: 2020-01-17 | Disposition: A | Discharge status: Home / Self Care | Payer: Managed Care, Other (non HMO) | Source: Ambulatory Visit | Attending: Family Medicine | Admitting: Family Medicine

## 2020-01-17 ENCOUNTER — Ambulatory Visit: Payer: Managed Care, Other (non HMO) | Admitting: Family Medicine

## 2020-01-17 DIAGNOSIS — R1031 Right lower quadrant pain: Secondary | ICD-10-CM

## 2020-01-17 MED ORDER — MELOXICAM 15 MG PO TABS: 15.0000 mg | ORAL_TABLET | Freq: Every day | ORAL | 0 refills | Status: DC | PRN

## 2020-01-17 NOTE — Patient Instructions (Addendum)
Try heat again to the painful area  Watch for swelling or bulge   Xray of hip now   Take meloxicam with food as needed   Most likely - this is a strained groin muscle - I think it should continue to improve

## 2020-01-17 NOTE — Progress Notes (Signed)
Subjective:    Patient ID: Danny Spencer, male    DOB: [birth year], 34 y.o.   MRN: [id number]  This visit occurred during the SARS-CoV-2 public health emergency.  Safety protocols were in place, including screening questions prior to the visit, additional usage of staff PPE, and extensive cleaning of exam room while observing appropriate contact time as indicated for disinfecting solutions.    HPI 34 yo pt of NP Clark presents with a personal problem   Wt Readings from Last 3 Encounters:  01/17/20 259 lb 6 oz (117.7 kg)  08/25/19 274 lb 9.6 oz (124.6 kg)  08/05/19 249 lb 8 oz (113.2 kg)   34.46 kg/m   About 2 years ago - went to ER for sharp pains in groin area  Did [name]- could not find hernia or other abn Sharp pains went away - some pressure since then  [name] from that time:  US SCROTUM W/DOPPLER (Accession [id number]) (Order [id number]) Imaging Date: 06/20/2018 Department: [facility]-EMERGENCY DEPT Released By/Authorizing: Dartha Lodge, PA-C (auto-released)  Exam Status  Status  Final [99]  PACS Intelerad Image Link  Show images for US SCROTUM W/DOPPLER  Study Result  CLINICAL DATA:  Right testicular and groin pain for the past 2 weeks.  EXAM: SCROTAL ULTRASOUND  DOPPLER ULTRASOUND OF THE TESTICLES  TECHNIQUE: Complete ultrasound examination of the testicles, epididymis, and other scrotal structures was performed. Color and spectral Doppler ultrasound were also utilized to evaluate blood flow to the testicles.  COMPARISON:  None.  FINDINGS: Right testicle  Measurements: 4.4 x 3.3 x 2.5 cm. No mass or microlithiasis visualized.  Left testicle  Measurements: 4.3 x 2.9 x 2.6 cm. No mass or microlithiasis visualized.  Right epididymis:  Normal in size and appearance.  Left epididymis:  Normal in size and appearance.  Hydrocele:  None visualized.  Varicocele:  None visualized.  Pulsed Doppler interrogation of both testes  demonstrates normal low resistance arterial and venous waveforms bilaterally.  Other: No abnormality demonstrated in the right angle canal region at the location of groin pain.  IMPRESSION: Normal examination.   Electronically Signed   By: Beckie Salts M.D.   On: 06/20/2018 18:38    Sunday night - turned in the shower (twisted) to reach for something  Pulling sensation in groin -R side pain Now sharp and stabbing pain   Since Saturday- 50% better but still there   Worse with bending forward or twisting  Tried to lie in bed-no help  nsaids do not help much - took 2 pills one time  Tried ice-no help  Tried heat- no help   He cannot feel a bulge  Some pain with straining (like picking his kids up)  No pain to have a BM    No swelling or either testicle  No urinary problems    No big injuries in the past  No regular exercise now-but active   Patient Active Problem List   Diagnosis Date Noted  . Right groin pain 01/17/2020  . Obesity (BMI 30-39.9) 08/05/2019  . OSA (obstructive sleep apnea) 08/05/2019  . Low HDL (under 40) 08/05/2019  . Anxiety 08/05/2019  . Snoring 10/20/2018  . Chest tightness 06/17/2016  . Numbness of upper extremity 06/17/2016   Past Medical History:  Diagnosis Date  . Obesity (BMI 30-39.9)   . Sleep apnea    Past Surgical History:  Procedure Laterality Date  . WISDOM TOOTH EXTRACTION     Social History   Tobacco Use  .  Smoking status: Never Smoker  . Smokeless tobacco: Never Used  Substance Use Topics  . Alcohol use: Yes    Alcohol/week: 1.0 standard drinks    Types: 1 Cans of beer per week  . Drug use: No   Family History  Problem Relation Age of Onset  . Congenital heart disease Son   . Lung cancer Maternal Grandmother    No Known Allergies Current Outpatient Medications on File Prior to Visit  Medication Sig Dispense Refill  . ibuprofen (ADVIL,MOTRIN) 200 MG tablet Take 400 mg by mouth daily as needed for headache  or moderate pain.     No current facility-administered medications on file prior to visit.    Review of Systems  Constitutional: Negative for activity change, appetite change, fatigue, fever and unexpected weight change.  HENT: Negative for congestion, rhinorrhea, sore throat and trouble swallowing.   Eyes: Negative for pain, redness, itching and visual disturbance.  Respiratory: Negative for cough, chest tightness, shortness of breath and wheezing.   Cardiovascular: Negative for chest pain and palpitations.  Gastrointestinal: Negative for abdominal pain, blood in stool, constipation, diarrhea and nausea.  Endocrine: Negative for cold intolerance, heat intolerance, polydipsia and polyuria.  Genitourinary: Negative for difficulty urinating, discharge, dysuria, flank pain, frequency, hematuria, penile pain, scrotal swelling, testicular pain and urgency.  Musculoskeletal: Negative for arthralgias, joint swelling and myalgias.       R groin pain  Skin: Negative for pallor and rash.  Neurological: Negative for dizziness, tremors, weakness, numbness and headaches.  Hematological: Negative for adenopathy. Does not bruise/bleed easily.  Psychiatric/Behavioral: Negative for decreased concentration and dysphoric mood. The patient is nervous/anxious.        Objective:   Physical Exam Constitutional:      General: He is not in acute distress.    Appearance: Normal appearance. He is obese. He is not ill-appearing or diaphoretic.  HENT:     Mouth/Throat:     Mouth: Mucous membranes are moist.  Eyes:     General:        Right eye: No discharge.        Left eye: No discharge.     Extraocular Movements: Extraocular movements intact.     Conjunctiva/sclera: Conjunctivae normal.     Pupils: Pupils are equal, round, and reactive to light.  Cardiovascular:     Rate and Rhythm: Normal rate and regular rhythm.  Pulmonary:     Effort: Pulmonary effort is normal. No respiratory distress.     Breath  sounds: Normal breath sounds. No wheezing or rales.  Abdominal:     General: Abdomen is flat. There is no distension.     Palpations: Abdomen is soft. There is no mass.     Tenderness: There is no abdominal tenderness. There is no right CVA tenderness, left CVA tenderness, guarding or rebound.     Hernia: No hernia is present.  Genitourinary:    Comments: No groin tenderness or inguinal adenopathy No bulges  No inguinal hernia noted  Nl appearing testicles w/o tenderness Musculoskeletal:     Cervical back: Normal range of motion and neck supple.     Right lower leg: No edema.     Left lower leg: No edema.     Comments: R groin pain reproduced with SLR on the R Also R hip ext rotation  No weakness No joint swelling   Lymphadenopathy:     Cervical: No cervical adenopathy.  Skin:    General: Skin is warm and dry.  Coloration: Skin is not pale.     Findings: No erythema or rash.  Neurological:     Mental Status: He is alert.     Cranial Nerves: No cranial nerve deficit.     Motor: No weakness.     Gait: Gait normal.  Psychiatric:        Mood and Affect: Mood normal.           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

## 2020-01-17 NOTE — Assessment & Plan Note (Addendum)
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

## 2020-01-18 ENCOUNTER — Telehealth: Payer: Self-pay | Admitting: *Deleted

## 2020-01-18 NOTE — Telephone Encounter (Signed)
Left VM requesting pt to call the office back regarding xray results 

## 2020-01-18 NOTE — Telephone Encounter (Signed)
Pt notified of xray results (addressed through result notes)

## 2020-02-09 ENCOUNTER — Other Ambulatory Visit: Payer: Self-pay | Admitting: Family Medicine

## 2020-02-09 NOTE — Telephone Encounter (Signed)
Last filled on 01/17/20 #30 tabs with 0 refills,01/17/20 was also last OV for groin pain, please advise

## 2020-06-25 IMAGING — DX DG HIP (WITH OR WITHOUT PELVIS) 2-3V*R*
3 series · 3 of 3 positions shown · non-contrast
Comparison: None.

CLINICAL DATA: Right groin pain.

EXAM:
DG HIP (WITH OR WITHOUT PELVIS) 2-3V RIGHT

[pelvis ap]
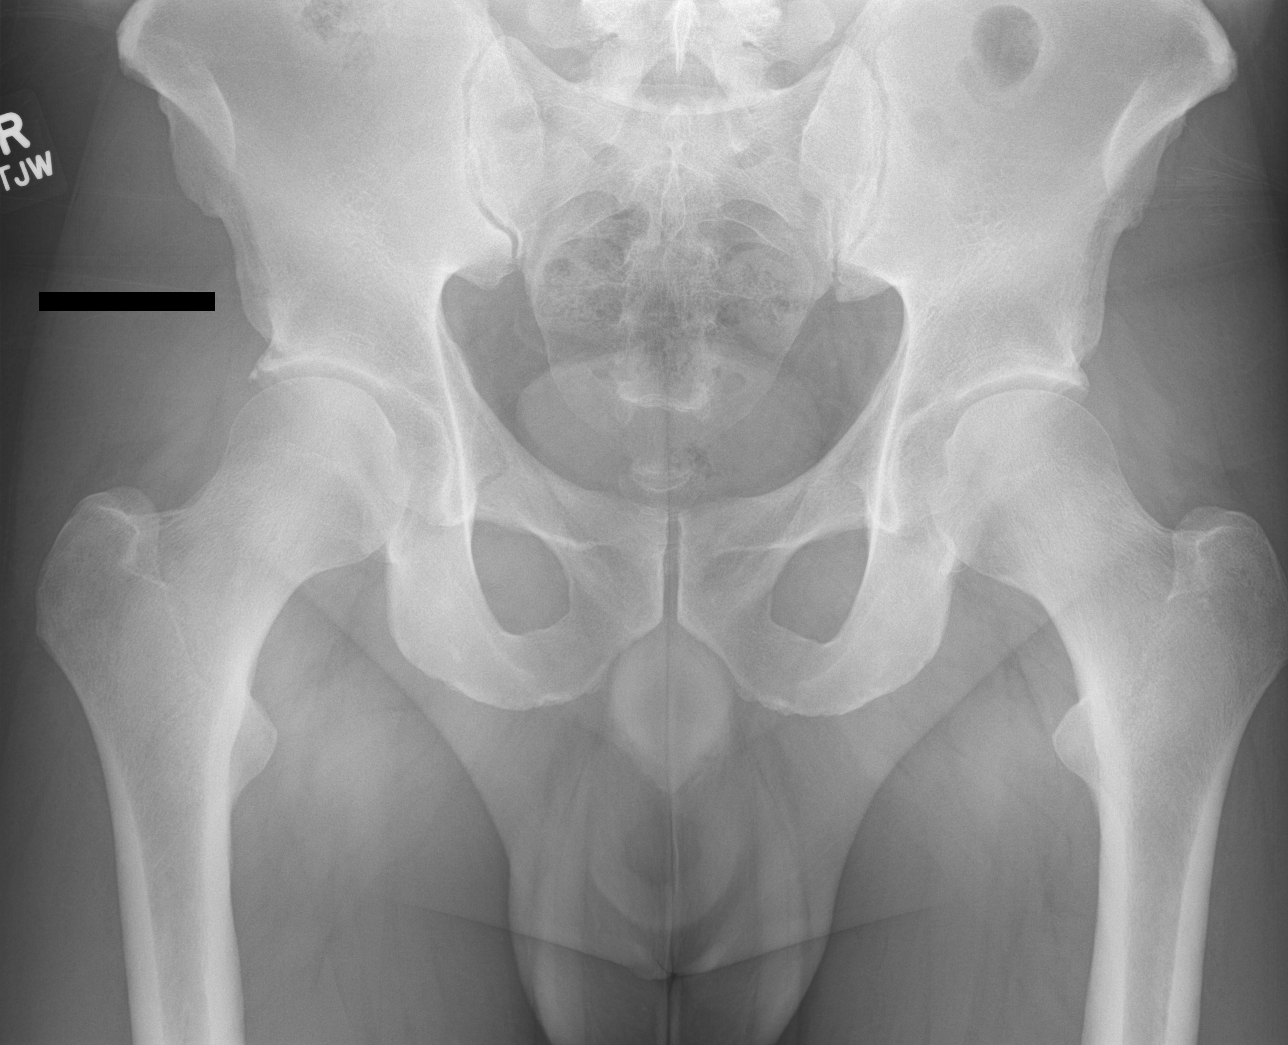

[hip ap]
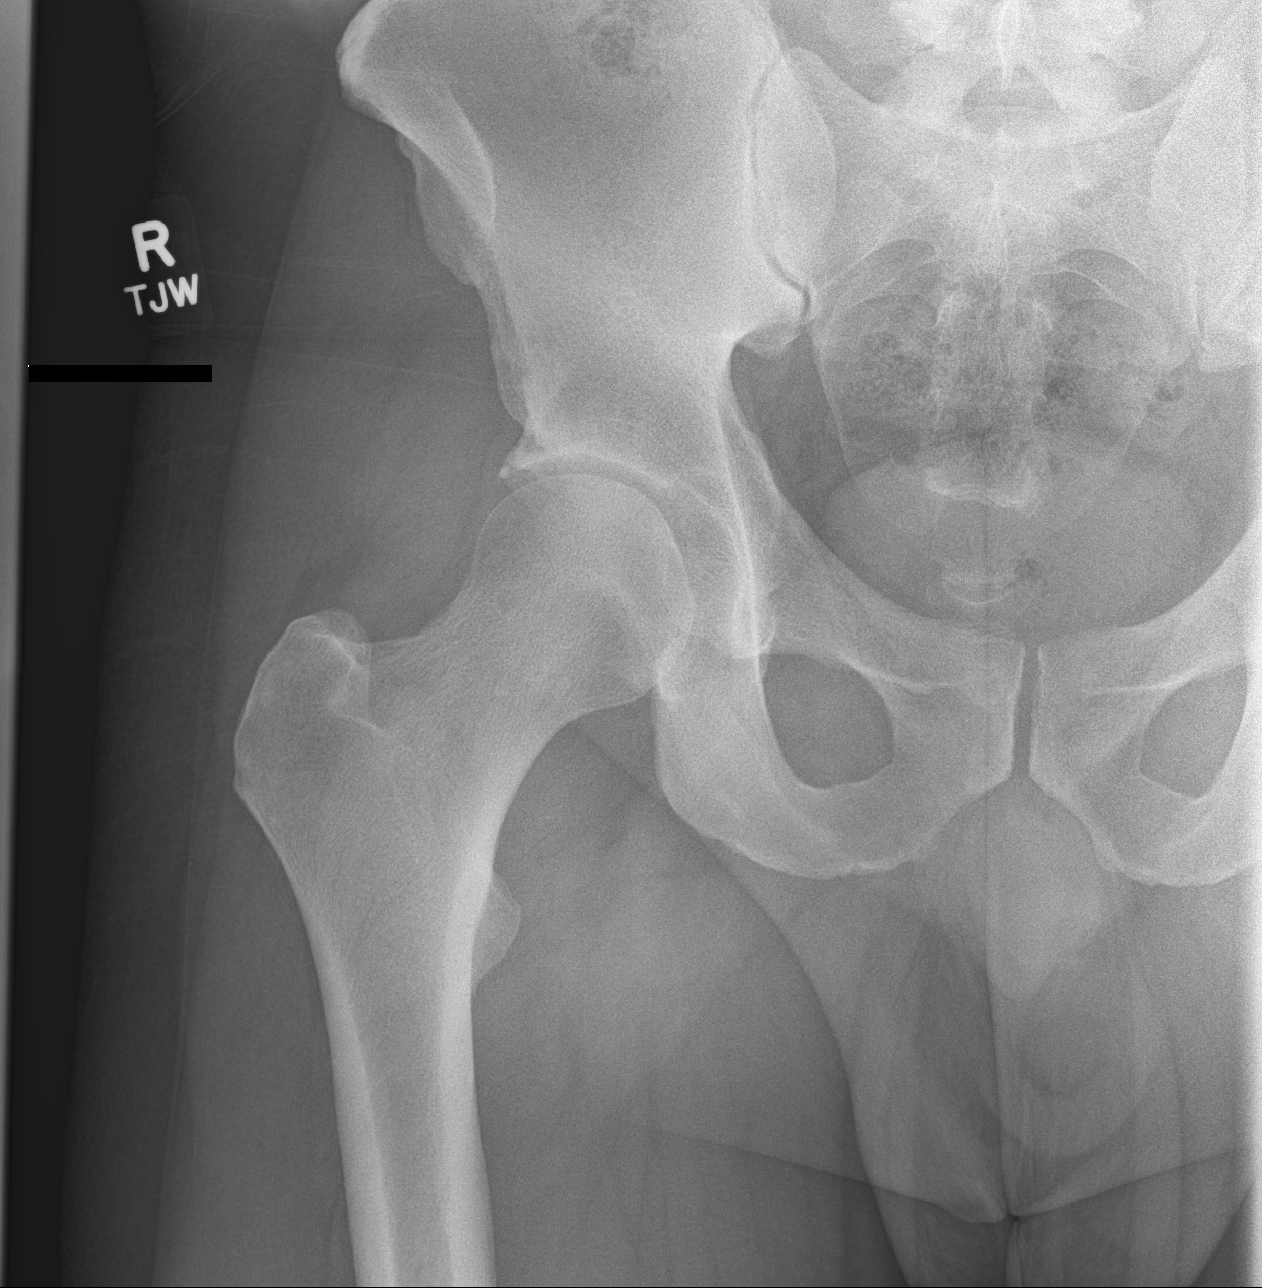

[hip lat]
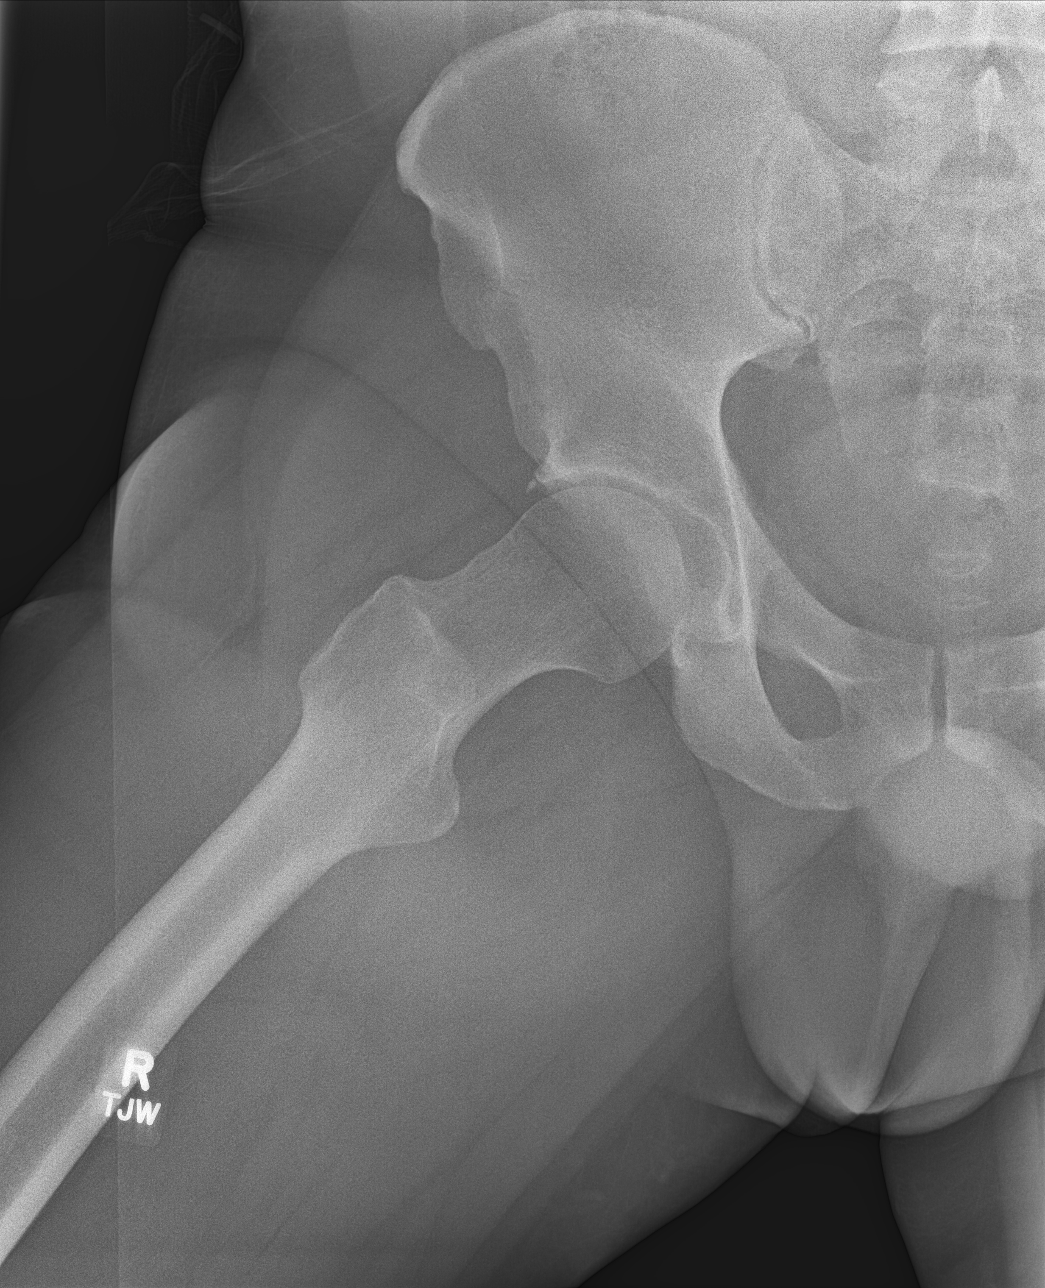

[3 of 3 positions shown; findings below may reference images not displayed]

FINDINGS: There is no evidence of hip fracture or dislocation. There is no
evidence of arthropathy or other focal bone abnormality.
IMPRESSION: Negative.

## 2021-01-15 ENCOUNTER — Emergency Department
Admission: EM | Admit: 2021-01-15 | Discharge: 2021-01-15 | Disposition: A | Discharge status: Home / Self Care | Payer: Managed Care, Other (non HMO) | Attending: Emergency Medicine | Admitting: Emergency Medicine

## 2021-01-15 ENCOUNTER — Other Ambulatory Visit: Payer: Self-pay

## 2021-01-15 DIAGNOSIS — R519 Headache, unspecified: Secondary | ICD-10-CM | POA: Diagnosis not present

## 2021-01-15 DIAGNOSIS — R112 Nausea with vomiting, unspecified: Secondary | ICD-10-CM | POA: Insufficient documentation

## 2021-01-15 LAB — COMPREHENSIVE METABOLIC PANEL
ALT: 86 U/L — ABNORMAL HIGH (ref 0–44)
AST: 52 U/L — ABNORMAL HIGH (ref 15–41)
Albumin: 4 g/dL (ref 3.5–5.0)
Alkaline Phosphatase: 55 U/L (ref 38–126)
Anion gap: 14 (ref 5–15)
BUN: 24 mg/dL — ABNORMAL HIGH (ref 6–20)
CO2: 25 mmol/L (ref 22–32)
Calcium: 8.9 mg/dL (ref 8.9–10.3)
Chloride: 102 mmol/L (ref 98–111)
Creatinine, Ser: 0.87 mg/dL (ref 0.61–1.24)
GFR, Estimated: >60 mL/min (ref >60)
Glucose, Bld: 101 mg/dL — ABNORMAL HIGH (ref 70–99)
Potassium: 4 mmol/L (ref 3.5–5.1)
Sodium: 141 mmol/L (ref 135–145)
Total Bilirubin: 1.4 mg/dL — ABNORMAL HIGH (ref 0.3–1.2)
Total Protein: 7.8 g/dL (ref 6.5–8.1)

## 2021-01-15 LAB — URINALYSIS, COMPLETE (UACMP) WITH MICROSCOPIC
Bacteria, UA: NONE SEEN
Bilirubin Urine: NEGATIVE
Glucose, UA: NEGATIVE mg/dL
Hgb urine dipstick: NEGATIVE
Ketones, ur: 20 mg/dL — AB
Leukocytes,Ua: NEGATIVE
Nitrite: NEGATIVE
Protein, ur: 30 mg/dL — AB
Specific Gravity, Urine: 1.028 (ref 1.005–1.030)
Squamous Epithelial / LPF: NONE SEEN (ref 0–5)
pH: 5 (ref 5.0–8.0)

## 2021-01-15 LAB — CBC
HCT: 46.2 % (ref 39.0–52.0)
Hemoglobin: 16.4 g/dL (ref 13.0–17.0)
MCH: 30.7 pg (ref 26.0–34.0)
MCHC: 35.5 g/dL (ref 30.0–36.0)
MCV: 86.4 fL (ref 80.0–100.0)
Platelets: 209 10*3/uL (ref 150–400)
RBC: 5.35 MIL/uL (ref 4.22–5.81)
RDW: 12.6 % (ref 11.5–15.5)
WBC: 9.6 10*3/uL (ref 4.0–10.5)
nRBC: 0 % (ref 0.0–0.2)

## 2021-01-15 LAB — LIPASE, BLOOD: Lipase: 54 U/L — ABNORMAL HIGH (ref 11–51)

## 2021-01-15 NOTE — ED Triage Notes (Signed)
Pt states N&V and fever x 2 days. States temp was 100 at home today. Denies diarrhea. C/o HA.   A&O, ambulatory. Sounds congested.

## 2021-01-16 ENCOUNTER — Telehealth (INDEPENDENT_AMBULATORY_CARE_PROVIDER_SITE_OTHER): Payer: Managed Care, Other (non HMO) | Admitting: Family Medicine

## 2021-01-16 ENCOUNTER — Encounter: Payer: Self-pay | Admitting: Family Medicine

## 2021-01-16 ENCOUNTER — Telehealth: Payer: Self-pay

## 2021-01-16 VITALS — Temp 98.1°F

## 2021-01-16 DIAGNOSIS — U071 COVID-19: Secondary | ICD-10-CM | POA: Diagnosis not present

## 2021-01-16 DIAGNOSIS — R112 Nausea with vomiting, unspecified: Secondary | ICD-10-CM

## 2021-01-16 MED ORDER — ONDANSETRON HCL 8 MG PO TABS: 8.0000 mg | ORAL_TABLET | Freq: Three times a day (TID) | ORAL | 0 refills | Status: AC | PRN

## 2021-01-16 NOTE — Telephone Encounter (Signed)
Left VM checking in on pt and seeing if they ended up going to the UC like they told Rena, if not I advise them Dr. Milinda Antis is okay seeing pt virtually at 4:30, I requested pt/wife to call back if they want the 4:30 virtual visit

## 2021-01-16 NOTE — Patient Instructions (Addendum)
Try the zofran for nausea Try to gradually rehydrate (water and gatorade are fine) and watch closely for signs of dehydration like dry mouth, rapid heart beat and dizziness  If you are unable to keep fluids down and become dehydrated or have shortness of breath please go to the ER

## 2021-01-16 NOTE — Telephone Encounter (Signed)
Eileen Stanford (DPR signed) said pt went to UC at [facility] last wk on 01/10/21 and dx with + covid and given a zpak; pt has not been able to keep liquids down; pt went to [facility] ED on 01/15/21 and was told could be 7 hr wait and pt left without being seen; [place] said paramedic gave pt a bag of IV fluids for dehydration on 01/15/21 and pt has not vomited since last night but pt has dry mouth and feels very nauseated and trying to take sips of gatorade and water. Pt has fever but Eileen Stanford is not sure how much fever pt has now; pt has H/A, dry cough and prod cough with clear phlegm. Pt also has S/T. Pt does not have SOB,Diarrhea,abd pain or loss of taste or smell. Pt refuses to go to ED but pt's wife will take pt to UC [facility] [address] for eval and possible testing and IV fluids if dehydrated. Sending note to Dr Milinda Antis.

## 2021-01-16 NOTE — Telephone Encounter (Signed)
Pt called back he didn't go to UC and wanted to see Dr. Milinda Antis virtually so appt added today at 4:30

## 2021-01-16 NOTE — Assessment & Plan Note (Signed)
Tested positive on 1/13 at [facility] Some respiratory symptoms (improved)  Now nausea is the primary symptom  Recommend he stop nsaids due to GI symptoms  Px zofran  Has had IVF as well  Disc ER parameters Continue to isolate and tx symptoms  Update if not starting to improve in a week or if worsening

## 2021-01-16 NOTE — Telephone Encounter (Signed)
Per Danny Spencer (and her note) pt's wife is taking pt to an UC right now, this was just an FYI to PCP,  I will check in an hr or so to make sure pt did go to [facility]

## 2021-01-16 NOTE — Telephone Encounter (Signed)
Please check in with him  If signs and symptoms of dehydration needs ER for fluids Otherwise are you open to adding a 4:30 virtual for him (if he wants to)?

## 2021-01-16 NOTE — Assessment & Plan Note (Signed)
With covid 19  After leaving ER w/o being seen pt called EMS and they gave him fluids and zofran at home Feeling some better and got 8 oz fluid in today  Sent px for zofran  Rev s/s of dehydration with pt and ER parameters  Will rest and continue to isolate  inst to call if worse or not improving over the next day

## 2021-01-16 NOTE — Telephone Encounter (Signed)
Please see phone note where spoke with pts wife below the access nurse note.

## 2021-01-16 NOTE — Progress Notes (Signed)
Virtual Visit via Video Note  I connected with Danny Spencer on 01/16/21 at  4:30 PM EST by a video enabled telemedicine application and verified that I am speaking with the correct person using two identifiers.  Location: Patient: home Provider: office    I discussed the limitations of evaluation and management by telemedicine and the availability of in person appointments. The patient expressed understanding and agreed to proceed.  Parties involved in encounter  Patient: Danny Spencer  Provider:  [name] MD   History of Present Illness: Pt presents for symptoms of covid 19  He was seen at [facility] on 1/13 and had positive test  He had neg strep test   Px zpak and medrol pack  Started taking that and the ST went away   Started vomiting on Sunday -could not keep anything down  Went on for 2 days  Last night had a bad headache (went to ER and could not tolerate waiting)  Paramedics came and gave him IVF and zofran at home That seemed to help  Able to get some gatorade and water down today (8 oz so far)   In bed most of the day  Nausea comes and goes   No diarrhea  Once had a little blood -Monday and none since   Cough- not bad  No sob no wheezing  ST better  Ears were hurting- better now  Some nasal congestion   No loss of taste /smell    Had a fever , today that is gone 98.1  Monday it was 102 degrees    Sick contacts -unknown   otc-nothing now Took ibuprofen   imm status  Not vaccinated for covid  No flu shot   Patient Active Problem List   Diagnosis Date Noted  . COVID-19 01/16/2021  . Nausea & vomiting 01/16/2021  . Right groin pain 01/17/2020  . Obesity (BMI 30-39.9) 08/05/2019  . OSA (obstructive sleep apnea) 08/05/2019  . Low HDL (under 40) 08/05/2019  . Anxiety 08/05/2019  . Snoring 10/20/2018  . Chest tightness 06/17/2016  . Numbness of upper extremity 06/17/2016   Past Medical History:  Diagnosis Date  . Obesity (BMI 30-39.9)   . Sleep  apnea    Past Surgical History:  Procedure Laterality Date  . WISDOM TOOTH EXTRACTION     Social History   Tobacco Use  . Smoking status: Never Smoker  . Smokeless tobacco: Never Used  Substance Use Topics  . Alcohol use: Yes    Alcohol/week: 1.0 standard drink    Types: 1 Cans of beer per week  . Drug use: No   Family History  Problem Relation Age of Onset  . Congenital heart disease Son   . Lung cancer Maternal Grandmother    No Known Allergies Current Outpatient Medications on File Prior to Visit  Medication Sig Dispense Refill  . ibuprofen (ADVIL,MOTRIN) 200 MG tablet Take 400 mg by mouth daily as needed for headache or moderate pain.     No current facility-administered medications on file prior to visit.   Review of Systems  Constitutional: Positive for malaise/fatigue. Negative for chills and fever.  HENT: Positive for congestion. Negative for ear pain, sinus pain and sore throat.   Eyes: Negative for blurred vision, discharge and redness.  Respiratory: Negative for cough, sputum production, shortness of breath and stridor.   Cardiovascular: Negative for chest pain, palpitations and leg swelling.  Gastrointestinal: Positive for nausea and vomiting. Negative for abdominal pain and diarrhea.  Musculoskeletal:  Negative for myalgias.  Skin: Negative for rash.  Neurological: Positive for headaches. Negative for dizziness.      Observations/Objective: Patient appears well, in no distress but appears fatigued  Weight is baseline  No facial swelling or asymmetry Normal voice-not hoarse and no slurred speech No obvious tremor or mobility impairment Moving neck and UEs normally Able to hear the call well  No cough or shortness of breath during interview  Talkative and mentally sharp with no cognitive changes No skin changes on face or neck , no rash or pallor Affect is normal    Assessment and Plan: Problem List Items Addressed This Visit      Digestive   Nausea  & vomiting    With covid 19  After leaving ER w/o being seen pt called EMS and they gave him fluids and zofran at home Feeling some better and got 8 oz fluid in today  Sent px for zofran  Rev s/s of dehydration with pt and ER parameters  Will rest and continue to isolate  inst to call if worse or not improving over the next day        Other   COVID-19 - Primary    Tested positive on 1/13 at [facility] Some respiratory symptoms (improved)  Now nausea is the primary symptom  Recommend he stop nsaids due to GI symptoms  Px zofran  Has had IVF as well  Disc ER parameters Continue to isolate and tx symptoms  Update if not starting to improve in a week or if worsening            Follow Up Instructions: Try the zofran for nausea Try to gradually rehydrate (water and gatorade are fine) and watch closely for signs of dehydration like dry mouth, rapid heart beat and dizziness  If you are unable to keep fluids down and become dehydrated or have shortness of breath please go to the ER   I discussed the assessment and treatment plan with the patient. The patient was provided an opportunity to ask questions and all were answered. The patient agreed with the plan and demonstrated an understanding of the instructions.   The patient was advised to call back or seek an in-person evaluation if the symptoms worsen or if the condition fails to improve as anticipated.     [name], MD

## 2021-01-16 NOTE — Telephone Encounter (Signed)
[facility] Night - Client TELEPHONE ADVICE RECORD AccessNurse Patient Name: Danny Spencer Gender: Male DOB: 08/02/1986 Age: 35 Y 11 M 15 D Return Phone Number: [phone] (Primary) Address: City/State/Zip: [place] [address] Client [facility] Night - Client Client Site [facility] - Night Physician [place], Idamae Schuller - MD Contact Type Call Who Is Calling Patient / Member / Family / Caregiver Call Type Triage / Clinical Caller Name Jayesh Marbach Relationship To Patient Spouse Return Phone Number [phone] (Primary) Chief Complaint Vomiting Reason for Call Symptomatic / Request for Health Information Initial Comment Caller's has been vomiting for several days, nasal congestion, cough, and headache. Translation No Nurse Assessment Nurse: Mayford Knife, RN, Lupe Carney Date/Time Lamount Cohen Time): 01/15/2021 5:18:38 PM Confirm and document reason for call. If symptomatic, describe symptoms. ---Caller's has been vomiting for several days, nasal congestion, cough, and headache. 102.5, 2 days ago. 99.8 has been on z pack and steroid Does the patient have any new or worsening symptoms? ---Yes Will a triage be completed? ---Yes Related visit to physician within the last 2 weeks? ---Yes Does the PT have any chronic conditions? (i.e. diabetes, asthma, this includes High risk factors for pregnancy, etc.) ---No Is this a behavioral health or substance abuse call? ---No Guidelines Guideline Title Affirmed Question Affirmed Notes Nurse Date/Time (Eastern Time) Vomiting Difficult to awaken or acting confused (e.g., disoriented, slurred speech) Mayford Knife, RN, Lupe Carney 01/15/2021 5:21:39 PM Disp. Time Lamount Cohen Time) Disposition Final User 01/15/2021 5:25:04 PM 911 Outcome Documentation Turner, RN, Lupe Carney Reason: Caller declines to call 911 at this time, needs to speak with family. Will make sure that pt receives care either with EMS or  at ED. 01/15/2021 5:24:02 PM Call EMS 911 Now Yes Turner, RN, Rebekah PLEASE NOTE: All timestamps contained within this report are represented as [place] Standard Time. CONFIDENTIALTY NOTICE: This fax transmission is intended only for the addressee. It contains information that is legally privileged, confidential or otherwise protected from use or disclosure. If you are not the intended recipient, you are strictly prohibited from reviewing, disclosing, copying using or disseminating any of this information or taking any action in reliance on or regarding this information. If you have received this fax in error, please notify us immediately by telephone so that we can arrange for its return to [name]. Phone: [phone], Toll-Free: [phone], Fax: [phone] Page: 2 of 2 Call Id: [id number] Caller Disagree/Comply Disagree Caller Understands Yes PreDisposition Call Doctor Care Advice Given Per Guideline CALL EMS 911 NOW: * Immediate medical attention is needed. You need to hang up and call 911 (or an ambulance). * Triager Discretion: I'll call you back in a few minutes to be sure you were able to reach them. CARE ADVICE per Vomiting (Adult) guideline.
